# Patient Record
Sex: Male | Born: 1965 | Race: Black or African American | Hispanic: No | Marital: Single | Smoking: Current every day smoker
Health system: Southern US, Community
[De-identification: ages and names within clinical notes are randomized; demographics above are authoritative.]

## PROBLEM LIST (undated history)

## (undated) DIAGNOSIS — M5432 Sciatica, left side: Secondary | ICD-10-CM

## (undated) HISTORY — DX: Sciatica, left side: M54.32

---

## 2002-12-31 ENCOUNTER — Encounter: Payer: Self-pay | Admitting: Emergency Medicine

## 2002-12-31 ENCOUNTER — Emergency Department (HOSPITAL_COMMUNITY): Admission: EM | Admit: 2002-12-31 | Discharge: 2002-12-31 | Payer: Self-pay | Admitting: Emergency Medicine

## 2004-07-04 ENCOUNTER — Emergency Department (HOSPITAL_COMMUNITY): Admission: EM | Admit: 2004-07-04 | Discharge: 2004-07-04 | Payer: Self-pay | Admitting: Emergency Medicine

## 2006-11-11 ENCOUNTER — Emergency Department (HOSPITAL_COMMUNITY): Admission: EM | Admit: 2006-11-11 | Discharge: 2006-11-11 | Payer: Self-pay | Admitting: Emergency Medicine

## 2016-06-01 ENCOUNTER — Encounter (HOSPITAL_COMMUNITY): Payer: Self-pay | Admitting: Emergency Medicine

## 2016-06-01 ENCOUNTER — Emergency Department (HOSPITAL_COMMUNITY)
Admission: EM | Admit: 2016-06-01 | Discharge: 2016-06-01 | Disposition: A | Discharge status: Home / Self Care | Payer: Self-pay | Attending: Emergency Medicine | Admitting: Emergency Medicine

## 2016-06-01 DIAGNOSIS — F17200 Nicotine dependence, unspecified, uncomplicated: Secondary | ICD-10-CM | POA: Insufficient documentation

## 2016-06-01 DIAGNOSIS — M5442 Lumbago with sciatica, left side: Secondary | ICD-10-CM | POA: Insufficient documentation

## 2016-06-01 DIAGNOSIS — M5432 Sciatica, left side: Secondary | ICD-10-CM

## 2016-06-01 MED ORDER — CYCLOBENZAPRINE HCL 10 MG PO TABS: 10.0000 mg | ORAL_TABLET | Freq: Three times a day (TID) | ORAL | 0 refills | Status: DC | PRN

## 2016-06-01 MED ORDER — HYDROCODONE-ACETAMINOPHEN 5-325 MG PO TABS
1.0000 | ORAL_TABLET | Freq: Once | ORAL | Status: AC
  Administered 2016-06-01: 1 via ORAL
  Filled 2016-06-01: qty 1

## 2016-06-01 MED ORDER — HYDROCODONE-ACETAMINOPHEN 5-325 MG PO TABS: ORAL_TABLET | ORAL | 0 refills | Status: DC

## 2016-06-01 MED ORDER — NAPROXEN 500 MG PO TABS: 500.0000 mg | ORAL_TABLET | Freq: Two times a day (BID) | ORAL | 0 refills | Status: DC

## 2016-06-01 MED ORDER — METHOCARBAMOL 500 MG PO TABS
500.0000 mg | ORAL_TABLET | Freq: Once | ORAL | Status: AC
  Administered 2016-06-01: 500 mg via ORAL
  Filled 2016-06-01: qty 1

## 2016-06-01 MED ORDER — IBUPROFEN 800 MG PO TABS
800.0000 mg | ORAL_TABLET | Freq: Once | ORAL | Status: AC
  Administered 2016-06-01: 800 mg via ORAL
  Filled 2016-06-01: qty 1

## 2016-06-01 NOTE — ED Notes (Signed)
PA at the bedside.

## 2016-06-01 NOTE — Discharge Instructions (Signed)
Alternate ice and heat to your back.  Follow-up with your doctor or return here for any worsening symptoms °

## 2016-06-01 NOTE — ED Notes (Signed)
Patient given discharge instruction, verbalized understand. Patient ambulatory out of the department.  

## 2016-06-01 NOTE — ED Provider Notes (Signed)
[facility]PT Provider Note   CSN: [id number] Arrival date & time: 06/01/16  1915     History   Chief Complaint Chief Complaint  Patient presents with  . Leg Pain    HPI Derek Watts is a 50 y.o. male.  HPI   Derek Watts is a 50 y.o. male who presents to the Emergency Department complaining of low back pain, hip pain and pain that radiates into his left leg.  Pain has been worsening for several weeks.  He describes a pain that is sharp and stabbing in quality.  Pain is slightly relieved by certain positions but worsens with standing or walking.  He denies fever, chills, urine or bowel changes, numbness or weakness or recent injury.     History reviewed. No pertinent past medical history.  There are no active problems to display for this patient.   History reviewed. No pertinent surgical history.     Home Medications    Prior to Admission medications   Not on File    Family History No family history on file.  Social History Social History  Substance Use Topics  . Smoking status: Current Every Day Smoker  . Smokeless tobacco: Never Used  . Alcohol use Yes     Allergies   Review of patient's allergies indicates not on file.   Review of Systems Review of Systems  Constitutional: Negative for chills and fever.  Respiratory: Negative for shortness of breath.   Gastrointestinal: Negative for abdominal pain and vomiting.  Genitourinary: Negative for decreased urine volume, difficulty urinating, dysuria, flank pain and hematuria.  Musculoskeletal: Positive for back pain. Negative for arthralgias and joint swelling.  Skin: Negative for color change, rash and wound.  Neurological: Negative for weakness and numbness.  All other systems reviewed and are negative.    Physical Exam Updated Vital Signs BP 162/79   Pulse 94   Temp 98.6 F (37 C)   Resp 20   Ht 5\' 10"  (1.778 m)   Wt 74.4 kg   SpO2 99%   BMI 23.53 kg/m   Physical Exam    Constitutional: He is oriented to person, place, and time. He appears well-developed and well-nourished. No distress.  HENT:  Head: Normocephalic and atraumatic.  Neck: Normal range of motion. Neck supple.  Cardiovascular: Normal rate, regular rhythm, normal heart sounds and intact distal pulses.   No murmur heard. Pulmonary/Chest: Effort normal and breath sounds normal. No respiratory distress.  Abdominal: Soft. He exhibits no distension. There is no tenderness.  Musculoskeletal: He exhibits tenderness. He exhibits no edema.       Lumbar back: He exhibits tenderness and pain. He exhibits normal range of motion, no swelling, no deformity, no laceration and normal pulse.  ttp of the lower lumbar spine and left lumbar paraspinal muscles.  DP pulses are brisk and symmetrical.  Distal sensation intact.  Pt has 5/5 strength against resistance of bilateral lower extremities.     Neurological: He is alert and oriented to person, place, and time. He has normal strength. No sensory deficit. He exhibits normal muscle tone. Coordination and gait normal.  Reflex Scores:      Patellar reflexes are 2+ on the right side and 2+ on the left side.      Achilles reflexes are 2+ on the right side and 2+ on the left side. Skin: Skin is warm and dry. No rash noted.  Nursing note and vitals reviewed.    ED Treatments / Results  Labs (all labs  ordered are listed, but only abnormal results are displayed) Labs Reviewed - No data to display  EKG  EKG Interpretation None       Radiology No results found.  Procedures Procedures (including critical care time)  Medications Ordered in ED Medications  HYDROcodone-acetaminophen (NORCO/VICODIN) 5-325 MG per tablet 1 tablet (not administered)  ibuprofen (ADVIL,MOTRIN) tablet 800 mg (not administered)  methocarbamol (ROBAXIN) tablet 500 mg (not administered)     Initial Impression / Assessment and Plan / ED Course  I have reviewed the triage vital signs  and the nursing notes.  Pertinent labs & imaging results that were available during my care of the patient were reviewed by me and considered in my medical decision making (see chart for details).  Clinical Course    Left sciatica.  No focal neuro deficits on exam.  Ambulates with a steady gait.  No focal neuro deficits.  Agrees to symptomatic tx and close PMD f/u  Appears stable for d/c  Final Clinical Impressions(s) / ED Diagnoses   Final diagnoses:  Sciatica of left side    New Prescriptions New Prescriptions   No medications on file     Pauline Ausammy Yafet Cline, Cordelia Poche-C 06/06/16 1636    Bethann BerkshireJoseph Zammit, MD 06/06/16 2221

## 2016-06-01 NOTE — ED Triage Notes (Signed)
Pt c/o left leg that radiates to the hip and lower back for weeks.

## 2016-08-18 DIAGNOSIS — Z139 Encounter for screening, unspecified: Secondary | ICD-10-CM

## 2016-08-18 LAB — GLUCOSE, POCT (MANUAL RESULT ENTRY): POC GLUCOSE: 111 mg/dL — AB (ref 70–99)

## 2016-08-18 NOTE — Congregational Nurse Program (Signed)
Congregational Nurse Program Note  Date of Encounter: 08/18/2016  Past Medical History: No past medical history on file.  Encounter Details:     CNP Questionnaire - 08/18/16 1430      Patient Demographics   Is this a new or existing patient? New   Patient is considered a/an Not Applicable   Race African-American/Black     Patient Assistance   [facility] Patient Assistance [facility]   Patient's financial/insurance status Low Income;Self-Pay (Uninsured)   Uninsured Patient ([place] Card/[facility]) Yes   Interventions Assisted patient in making appt.   Patient referred to apply for the following financial assistance [facility];[place] [employer]/[facility] [employer]   Food insecurities addressed Referred to food bank or Chart[employer] loss adjusterresource   Transportation assistance No   Assistance securing medications No   Product/process development scientistducational health offerings Navigating the healthcare system     Encounter Details   Primary purpose of visit Education/Health Concerns;Navigating the Healthcare System   Was an Emergency Department visit averted? Not Applicable   Does patient have a medical provider? No   Patient referred to Area Agency;Clinic;Establish PCP   Was a mental health screening completed? (GAINS tool) No   Does patient have dental issues? Yes   Was a dental referral made? Yes  to connect to care connect also gave date of next dental bus in Jan, 2018   Does patient have vision issues? No   Does your patient have an abnormal blood pressure today? No   Since previous encounter, have you referred patient for abnormal blood pressure that resulted in a new diagnosis or medication change? No   Does your patient have an abnormal blood glucose today? No   Since previous encounter, have you referred patient for abnormal blood glucose that resulted in a new diagnosis or medication change? No   Was there a life-saving intervention made? No     [facility] Chief complaint today is ongoing  lower back and left hip pain that radiates down his left leg. Pain is sharp and client states it started 3 to 4 months ago and that he [facility] in October and was given flexeril, hydrocodone and naproxen for pain and told to come back if it didn't get better per client , however he never went back. Client is now seeking a primary care provider for general medical care as well as care regarding his back and left leg pain. Client also complains of dental pain. Alert and oriented to person, place and time. Client states he lives from [place] staying with family. He states that he has not worked in about a month and that was "temp" work. He states he does not recall injury to his back and states that he just "woke up like this". He relates that his pain becomes worse with walking or standing and that he has noticed a limp. Gait with a slight left limp. Client reports numbness of his left foot and pain starts in lower back, radiates down left hip area and down leg. Lying down relieves his pain. 4 + bilateral grips upper extremities. Client denies drug use and reports "one 12 ounce beer a week".  Client reports that other than the emergency room he has not seen a primary care doctor in many years.  PMH: none reported Past surgical history: none reported. Current complaints , lower back and left leg pain and left foot numbness, also states his left arm  gets numb sometimes at night when sleeping. He also reports getting up 3 to 4 times per night to urinate. Vitals today: Blood pressure 136/87, pulse 76, temp 98.2 orally, non-fasting blood sugar 111 Mental Health: Client denies any past suicide attempts and denies current thoughts of suicide. He states that he has no anxiety. Appears calm.  Plan: [facility] [place] and appointment secured for 08/25/16 at 1:15 pm. For general medical care.  Counseled client on starting an application with  the Housing Authority for possible housing options: Client aware that due to [employer] will re-open on 08/24/16 at 0800.  Referral for client to start [facility] discount application regarding past ER bill as well as possible need for further testing. Fransisca ConnorsAshley Hilton contact information [facility] to [facility] at [facility].  Referral also to the [employer] food pant[employer] for clothing vouchers and also food assistance. Client recently applied for food stamps as well as disability per client.  Dental: contact information for [facility] given to client as well as the date and location of the next [facility]. Address for rescue mission also given to client along with time to be there waiting and that patients will be seen on first come first serve basis. Client states understanding. He is complaining of dental pain that comes and goes and states he has been neglecting care of his teeth.   Client given list of all information as well as appointment card and RN contact information , along with educational handouts on healthy tips , decreasing stress and smoking cessation. Will follow up with client after his appointment at the free clinic.

## 2016-08-30 ENCOUNTER — Ambulatory Visit: Payer: Self-pay | Admitting: Physician Assistant

## 2016-08-30 ENCOUNTER — Encounter: Payer: Self-pay | Admitting: Physician Assistant

## 2016-08-30 VITALS — BP 146/70 | HR 105 | Temp 97.7°F | Ht 68.5 in | Wt 157.8 lb

## 2016-08-30 DIAGNOSIS — F1010 Alcohol abuse, uncomplicated: Secondary | ICD-10-CM

## 2016-08-30 DIAGNOSIS — F17210 Nicotine dependence, cigarettes, uncomplicated: Secondary | ICD-10-CM

## 2016-08-30 DIAGNOSIS — I10 Essential (primary) hypertension: Secondary | ICD-10-CM

## 2016-08-30 DIAGNOSIS — Z13220 Encounter for screening for lipoid disorders: Secondary | ICD-10-CM

## 2016-08-30 DIAGNOSIS — R269 Unspecified abnormalities of gait and mobility: Secondary | ICD-10-CM

## 2016-08-30 DIAGNOSIS — K219 Gastro-esophageal reflux disease without esophagitis: Secondary | ICD-10-CM

## 2016-08-30 DIAGNOSIS — G8929 Other chronic pain: Secondary | ICD-10-CM

## 2016-08-30 DIAGNOSIS — R079 Chest pain, unspecified: Secondary | ICD-10-CM

## 2016-08-30 DIAGNOSIS — Z125 Encounter for screening for malignant neoplasm of prostate: Secondary | ICD-10-CM

## 2016-08-30 DIAGNOSIS — Z131 Encounter for screening for diabetes mellitus: Secondary | ICD-10-CM

## 2016-08-30 DIAGNOSIS — M5432 Sciatica, left side: Secondary | ICD-10-CM

## 2016-08-30 DIAGNOSIS — M5442 Lumbago with sciatica, left side: Secondary | ICD-10-CM

## 2016-08-30 LAB — GLUCOSE, POCT (MANUAL RESULT ENTRY): POC GLUCOSE: 124 mg/dL — AB (ref 70–99)

## 2016-08-30 MED ORDER — METOPROLOL TARTRATE 50 MG PO TABS: 50.0000 mg | ORAL_TABLET | Freq: Two times a day (BID) | ORAL | 1 refills | Status: DC

## 2016-08-30 MED ORDER — RANITIDINE HCL 300 MG PO TABS: 300.0000 mg | ORAL_TABLET | Freq: Every day | ORAL | 1 refills | Status: DC

## 2016-08-30 NOTE — Progress Notes (Signed)
BP (!) 146/70 (BP Location: Left Arm, Patient Position: Sitting, Cuff Size: Normal)   Pulse (!) 105   Temp 97.7 F (36.5 C)   Ht 5' 8.5" (1.74 m)   Wt 157 lb 12 oz (71.6 kg)   SpO2 99%   BMI 23.64 kg/m    Subjective:    Patient ID: Derek Watts, male    DOB: Apr 11, 1966, 51 y.o.   MRN: [id number]  HPI: Derek Watts is a 51 y.o. male presenting on 08/30/2016 for New Patient (Initial Visit)   HPI   Pt has not gotten routine medical care in many years.  He States that he gets CP sometimes.  "Mostly comes in middle of night".  Pain lasts 15-20 minutes then he goes back to sleep.  No self treatment.  This pain does not come during the day.  Thinks  It's usually around 11pm or 12 when he gets the pain.  So he hasn't been asleep too long when he hurts.  No associated sob or nausea or diaphoresis.   Cp feels like a heavy weight on his chest.   He says it comes maybe once or twice/month.  He does not get the pain during the day even with exertion.  He denies SOB and DOE.    Pt c/o LBP started about 6 months ago.  Pain radiates in to LLE, sometimes down all the way into his feet.  States mainly on left side of body.   Pt doesn't remember any injury.    Pt is not working now.  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     Pt currently smokes about 1/2 ppd and he drinks.  He says he doesn't drink every day but will drink about 6 or more beers/day on 3 or 4 days/week.  He says that sometimes he will pass out from drinking so much alcohol.    He says that he never thought about whether he had a problem with alcohol, but says that now that I am asking, he says he may.   He does not use any street drugs.   Relevant past medical, surgical, family and social history reviewed and updated as indicated. Interim medical history since our last visit reviewed. Allergies and medications reviewed and updated.  No current outpatient prescriptions  on file.   Review of Systems  Constitutional: Negative for appetite change, chills, diaphoresis, fatigue, fever and unexpected weight change.  HENT: Positive for dental problem and sneezing. Negative for congestion, drooling, ear pain, facial swelling, hearing loss, mouth sores, sore throat, trouble swallowing and voice change.   Eyes: Negative for pain, discharge, redness, itching and visual disturbance.  Respiratory: Negative for cough, choking, shortness of breath and wheezing.   Cardiovascular: Positive for chest pain. Negative for palpitations and leg swelling.  Gastrointestinal: Negative for abdominal pain, blood in stool, constipation, diarrhea and vomiting.  Endocrine: Negative for cold intolerance, heat intolerance and polydipsia.  Genitourinary: Negative for decreased urine volume, dysuria and hematuria.  Musculoskeletal: Positive for arthralgias, back pain and gait problem.  Skin: Negative for rash.  Allergic/Immunologic: Negative for environmental allergies.  Neurological: Negative for seizures, syncope, light-headedness and headaches.  Hematological: Negative for adenopathy.  Psychiatric/Behavioral: Negative for agitation, dysphoric mood and suicidal ideas. The patient is not nervous/anxious.     Per HPI unless specifically indicated above     Objective:    BP (!) 146/70 (BP Location: Left Arm, Patient Position:  Sitting, Cuff Size: Normal)   Pulse (!) 105   Temp 97.7 F (36.5 C)   Ht 5' 8.5" (1.74 m)   Wt 157 lb 12 oz (71.6 kg)   SpO2 99%   BMI 23.64 kg/m   Wt Readings from Last 3 Encounters:  08/30/16 157 lb 12 oz (71.6 kg)  08/18/16 159 lb 9.6 oz (72.4 kg)  06/01/16 164 lb (74.4 kg)    Physical Exam  Constitutional: He is oriented to person, place, and time. He appears well-developed and well-nourished.  HENT:  Head: Normocephalic and atraumatic.  Mouth/Throat: Uvula is midline and oropharynx is clear and moist. Abnormal dentition. Dental caries present. No  dental abscesses. No oropharyngeal exudate.  Eyes: Conjunctivae and EOM are normal. Pupils are equal, round, and reactive to light.  Neck: Neck supple. No thyromegaly present.  Cardiovascular: Normal rate and regular rhythm.   Pulmonary/Chest: Effort normal and breath sounds normal. He has no wheezes. He has no rales.  Abdominal: Soft. Bowel sounds are normal. He exhibits no mass. There is no hepatosplenomegaly. There is no tenderness. There is no rigidity, no rebound, no guarding and no CVA tenderness.  Musculoskeletal: He exhibits no edema.  Lymphadenopathy:    He has no cervical adenopathy.  Neurological: He is alert and oriented to person, place, and time.  Skin: Skin is warm and dry. No rash noted.  Psychiatric: He has a normal mood and affect. His behavior is normal. Thought content normal.  Vitals reviewed.   EKG- NSR at 89 bmp. No ST elevation. No previous for comparison.    Results for orders placed or performed in visit on 08/30/16  POCT Glucose (CBG)  Result Value Ref Range   POC Glucose 124 (A) 70 - 99 mg/dl      Assessment & Plan:    Encounter Diagnoses  Name Primary?  . Essential hypertension Yes  . Chest pain, unspecified type   . Sciatica of left side   . Abnormality of gait   . Gastroesophageal reflux disease, esophagitis presence not specified   . Screening for diabetes mellitus (DM)   . Screening cholesterol level   . Screening for prostate cancer   . Alcohol abuse   . Cigarette nicotine dependence without complication   . Chronic low back pain with left-sided sciatica, unspecified back pain laterality     -pt will get Baseline labs drawn tomorrow morning -gave pt [facility] discount application to turn in asap -ordered Xray l-s spine/pelvis/hip  -rx metoprolol and asa for blood press -discussed with pt that his nocturnal pains are likely due to GERD.  counselsed on gerd and gave handout.  rx ranitidine -discussed alcohol with pt and his need to stop it.   Gave pt list of local AA meetings -discussed the role smoking plays in GERD and HTN but he says now isn't the time to try to stop if he is going to be trying to stop drinking -follow up 4 weeks.  RTO sooner prn worsening or new symptoms

## 2016-08-30 NOTE — Patient Instructions (Addendum)
rx metoprolol (for blood pressure) and ranitidine (for GERD) Start aspirin once daily Stop alcohol- go to AA meetings if needed Turn in [facility] discount application Fasting labs tomorrow morning Get xray tomorrow when turn in [facility] discount application Cut back or stop smoking  ____________________________________________________________  Heartburn Heartburn is a type of pain or discomfort that can happen in the throat or chest. It is often described as a burning pain. It may also cause a bad taste in the mouth. Heartburn may feel worse when you lie down or bend over, and it is often worse at night. Heartburn may be caused by stomach contents that move back up into the esophagus (reflux). Follow these instructions at home: Take these actions to decrease your discomfort and to help avoid complications. Diet  Follow a diet as recommended by your health care provider. This may involve avoiding foods and drinks such as:  Coffee and tea (with or without caffeine).  Drinks that contain alcohol.  Energy drinks and sports drinks.  Carbonated drinks or sodas.  Chocolate and cocoa.  Peppermint and mint flavorings.  Garlic and onions.  Horseradish.  Spicy and acidic foods, including peppers, chili powder, curry powder, vinegar, hot sauces, and barbecue sauce.  Citrus fruit juices and citrus fruits, such as oranges, lemons, and limes.  Tomato-based foods, such as red sauce, chili, salsa, and pizza with red sauce.  Fried and fatty foods, such as donuts, french fries, potato chips, and high-fat dressings.  High-fat meats, such as hot dogs and fatty cuts of red and white meats, such as rib eye steak, sausage, ham, and bacon.  High-fat dairy items, such as whole milk, butter, and cream cheese.  Eat small, frequent meals instead of large meals.  Avoid drinking large amounts of liquid with your meals.  Avoid eating meals during the 2-3 hours before bedtime.  Avoid lying down right after  you eat.  Do not exercise right after you eat. General instructions  Pay attention to any changes in your symptoms.  Take over-the-counter and prescription medicines only as told by your health care provider. Do not take aspirin, ibuprofen, or other NSAIDs unless your health care provider told you to do so.  Do not use any tobacco products, including cigarettes, chewing tobacco, and e-cigarettes. If you need help quitting, ask your health care provider.  Wear loose-fitting clothing. Do not wear anything tight around your waist that causes pressure on your abdomen.  Raise (elevate) the head of your bed about 6 inches (15 cm).  Try to reduce your stress, such as with yoga or meditation. If you need help reducing stress, ask your health care provider.  If you are overweight, reduce your weight to an amount that is healthy for you. Ask your health care provider for guidance about a safe weight loss goal.  Keep all follow-up visits as told by your health care provider. This is important. Contact a health care provider if:  You have new symptoms.  You have unexplained weight loss.  You have difficulty swallowing, or it hurts to swallow.  You have wheezing or a persistent cough.  Your symptoms do not improve with treatment.  You have frequent heartburn for more than two weeks. Get help right away if:  You have pain in your arms, neck, jaw, teeth, or back.  You feel sweaty, dizzy, or light-headed.  You have chest pain or shortness of breath.  You vomit and your vomit looks like blood or coffee grounds.  Your stool is bloody  or black. This information is not intended to replace advice given to you by your health care provider. Make sure you discuss any questions you have with your health care provider. Document Released: 01/01/2009 Document Revised: 01/21/2016 Document Reviewed: 12/10/2014 Elsevier Interactive Patient Education  2017 [employer].

## 2016-09-06 ENCOUNTER — Ambulatory Visit (HOSPITAL_COMMUNITY)
Admission: RE | Admit: 2016-09-06 | Discharge: 2016-09-06 | Disposition: A | Discharge status: Home / Self Care | Payer: Self-pay | Source: Ambulatory Visit | Attending: Physician Assistant | Admitting: Physician Assistant

## 2016-09-06 DIAGNOSIS — M549 Dorsalgia, unspecified: Secondary | ICD-10-CM | POA: Insufficient documentation

## 2016-09-06 DIAGNOSIS — M25559 Pain in unspecified hip: Secondary | ICD-10-CM | POA: Insufficient documentation

## 2016-09-08 LAB — COMPREHENSIVE METABOLIC PANEL
ALBUMIN: 4.5 g/dL (ref 3.6–5.1)
ALT: 8 U/L — ABNORMAL LOW (ref 9–46)
AST: 14 U/L (ref 10–35)
Alkaline Phosphatase: 88 U/L (ref 40–115)
BUN: 11 mg/dL (ref 7–25)
CHLORIDE: 107 mmol/L (ref 98–110)
CO2: 26 mmol/L (ref 20–31)
CREATININE: 0.9 mg/dL (ref 0.70–1.33)
Calcium: 9.1 mg/dL (ref 8.6–10.3)
Glucose, Bld: 73 mg/dL (ref 65–99)
POTASSIUM: 4.6 mmol/L (ref 3.5–5.3)
SODIUM: 141 mmol/L (ref 135–146)
Total Bilirubin: 0.5 mg/dL (ref 0.2–1.2)
Total Protein: 6.9 g/dL (ref 6.1–8.1)

## 2016-09-08 LAB — CBC WITH DIFFERENTIAL/PLATELET
Basophils Absolute: 0 cells/uL (ref 0–200)
Basophils Relative: 0 %
EOS ABS: 252 {cells}/uL (ref 15–500)
Eosinophils Relative: 3 %
HEMATOCRIT: 35.8 % — AB (ref 38.5–50.0)
HEMOGLOBIN: 11 g/dL — AB (ref 13.2–17.1)
LYMPHS ABS: 2268 {cells}/uL (ref 850–3900)
LYMPHS PCT: 27 %
MCH: 19.2 pg — ABNORMAL LOW (ref 27.0–33.0)
MCHC: 30.7 g/dL — ABNORMAL LOW (ref 32.0–36.0)
MCV: 62.6 fL — AB (ref 80.0–100.0)
MONO ABS: 588 {cells}/uL (ref 200–950)
MPV: 8.9 fL (ref 7.5–12.5)
Monocytes Relative: 7 %
Neutro Abs: 5292 cells/uL (ref 1500–7800)
Neutrophils Relative %: 63 %
Platelets: 261 10*3/uL (ref 140–400)
RBC: 5.72 MIL/uL (ref 4.20–5.80)
RDW: 19.4 % — ABNORMAL HIGH (ref 11.0–15.0)
WBC: 8.4 10*3/uL (ref 3.8–10.8)

## 2016-09-08 LAB — HEMOGLOBIN A1C
HEMOGLOBIN A1C: 4.8 % (ref <5.7)
Mean Plasma Glucose: 91 mg/dL

## 2016-09-08 LAB — TSH: TSH: 0.53 mIU/L (ref 0.40–4.50)

## 2016-09-08 LAB — LIPID PANEL
CHOL/HDL RATIO: 1.9 ratio (ref <5.0)
CHOLESTEROL: 137 mg/dL (ref <200)
HDL: 71 mg/dL (ref >40)
LDL Cholesterol: 56 mg/dL (ref <100)
TRIGLYCERIDES: 48 mg/dL (ref <150)
VLDL: 10 mg/dL (ref <30)

## 2016-09-08 LAB — VITAMIN B12: Vitamin B-12: 302 pg/mL (ref 200–1100)

## 2016-09-08 LAB — PSA: PSA: 1.3 ng/mL (ref <=4.0)

## 2016-09-13 LAB — VITAMIN B1: VITAMIN B1 (THIAMINE): 10 nmol/L (ref 8–30)

## 2016-09-27 ENCOUNTER — Ambulatory Visit: Payer: Self-pay | Admitting: Physician Assistant

## 2016-09-27 ENCOUNTER — Encounter: Payer: Self-pay | Admitting: Physician Assistant

## 2016-09-27 VITALS — BP 106/68 | HR 77 | Temp 97.3°F | Ht 68.5 in | Wt 158.0 lb

## 2016-09-27 DIAGNOSIS — I10 Essential (primary) hypertension: Secondary | ICD-10-CM

## 2016-09-27 DIAGNOSIS — M545 Low back pain: Secondary | ICD-10-CM

## 2016-09-27 DIAGNOSIS — G8929 Other chronic pain: Secondary | ICD-10-CM

## 2016-09-27 DIAGNOSIS — M549 Dorsalgia, unspecified: Secondary | ICD-10-CM

## 2016-09-27 DIAGNOSIS — K219 Gastro-esophageal reflux disease without esophagitis: Secondary | ICD-10-CM

## 2016-09-27 DIAGNOSIS — D649 Anemia, unspecified: Secondary | ICD-10-CM

## 2016-09-27 DIAGNOSIS — F17210 Nicotine dependence, cigarettes, uncomplicated: Secondary | ICD-10-CM

## 2016-09-27 NOTE — Patient Instructions (Signed)
Start iron to help anemia  Continue metoprolol and ranitidine  Return stool test (poop test)

## 2016-09-27 NOTE — Progress Notes (Signed)
BP 106/68 (BP Location: Left Arm, Patient Position: Sitting, Cuff Size: Normal)   Pulse 77   Temp 97.3 F (36.3 C)   Ht 5' 8.5" (1.74 m)   Wt 158 lb (71.7 kg)   SpO2 99%   BMI 23.67 kg/m    Subjective:    Patient ID: Derek Watts, male    DOB: Nov 07, 1965, 51 y.o.   MRN: [id number]  HPI: Derek Watts is a 51 y.o. male presenting on 09/27/2016 for Hypertension and Pain   HPI   Pt states he is no longer having any CP- none at all.  His last one was about one week after his new pt appt.   Pt turned in his CD app (given for xrays)  Pt has cut back drinking.  He says he doesn't drink liquor any more.   He continues to Smoke  Relevant past medical, surgical, family and social history reviewed and updated as indicated. Interim medical history since our last visit reviewed. Allergies and medications reviewed and updated.   Current Outpatient Prescriptions:  .  aspirin EC 81 MG tablet, Take 81 mg by mouth daily., Disp: , Rfl:  .  metoprolol (LOPRESSOR) 50 MG tablet, Take 1 tablet (50 mg total) by mouth 2 (two) times daily., Disp: 60 tablet, Rfl: 1 .  ranitidine (ZANTAC) 300 MG tablet, Take 1 tablet (300 mg total) by mouth at bedtime., Disp: 30 tablet, Rfl: 1  Review of Systems  Constitutional: Negative for appetite change, chills, diaphoresis, fatigue, fever and unexpected weight change.  HENT: Negative for congestion, drooling, ear pain, facial swelling, hearing loss, mouth sores, sneezing, sore throat, trouble swallowing and voice change.   Eyes: Negative for pain, discharge, redness, itching and visual disturbance.  Respiratory: Negative for cough, choking, shortness of breath and wheezing.   Cardiovascular: Negative for chest pain, palpitations and leg swelling.  Gastrointestinal: Negative for abdominal pain, blood in stool, constipation, diarrhea and vomiting.  Endocrine: Negative for cold intolerance, heat intolerance and polydipsia.  Genitourinary: Negative for  decreased urine volume, dysuria and hematuria.  Musculoskeletal: Negative for arthralgias, back pain and gait problem.  Skin: Negative for rash.  Allergic/Immunologic: Negative for environmental allergies.  Neurological: Negative for seizures, syncope, light-headedness and headaches.  Hematological: Negative for adenopathy.  Psychiatric/Behavioral: Negative for agitation, dysphoric mood and suicidal ideas. The patient is not nervous/anxious.     Per HPI unless specifically indicated above     Objective:    BP 106/68 (BP Location: Left Arm, Patient Position: Sitting, Cuff Size: Normal)   Pulse 77   Temp 97.3 F (36.3 C)   Ht 5' 8.5" (1.74 m)   Wt 158 lb (71.7 kg)   SpO2 99%   BMI 23.67 kg/m   Wt Readings from Last 3 Encounters:  09/27/16 158 lb (71.7 kg)  08/30/16 157 lb 12 oz (71.6 kg)  08/18/16 159 lb 9.6 oz (72.4 kg)    Physical Exam  Constitutional: He is oriented to person, place, and time. He appears well-developed and well-nourished.  HENT:  Head: Normocephalic and atraumatic.  Neck: Neck supple.  Cardiovascular: Normal rate and regular rhythm.   Pulmonary/Chest: Effort normal and breath sounds normal. He has no wheezes.  Abdominal: Soft. Bowel sounds are normal. There is no hepatosplenomegaly. There is no tenderness.  Musculoskeletal: He exhibits no edema.  Lymphadenopathy:    He has no cervical adenopathy.  Neurological: He is alert and oriented to person, place, and time.  Skin: Skin is warm and dry.  Psychiatric:  He has a normal mood and affect. His behavior is normal.  Vitals reviewed.   Results for orders placed or performed in visit on 08/30/16  HgB A1c  Result Value Ref Range   Hgb A1c MFr Bld 4.8 <5.7 %   Mean Plasma Glucose 91 mg/dL  PSA  Result Value Ref Range   PSA 1.3 <=4.0 ng/mL  Comprehensive Metabolic Panel (CMET)  Result Value Ref Range   Sodium 141 135 - 146 mmol/L   Potassium 4.6 3.5 - 5.3 mmol/L   Chloride 107 98 - 110 mmol/L   CO2  26 20 - 31 mmol/L   Glucose, Bld 73 65 - 99 mg/dL   BUN 11 7 - 25 mg/dL   Creat 0.90 0.70 - 1.33 mg/dL   Total Bilirubin 0.5 0.2 - 1.2 mg/dL   Alkaline Phosphatase 88 40 - 115 U/L   AST 14 10 - 35 U/L   ALT 8 (L) 9 - 46 U/L   Total Protein 6.9 6.1 - 8.1 g/dL   Albumin 4.5 3.6 - 5.1 g/dL   Calcium 9.1 8.6 - 10.3 mg/dL  CBC w/Diff/Platelet  Result Value Ref Range   WBC 8.4 3.8 - 10.8 K/uL   RBC 5.72 4.20 - 5.80 MIL/uL   Hemoglobin 11.0 (L) 13.2 - 17.1 g/dL   HCT 35.8 (L) 38.5 - 50.0 %   MCV 62.6 (L) 80.0 - 100.0 fL   MCH 19.2 (L) 27.0 - 33.0 pg   MCHC 30.7 (L) 32.0 - 36.0 g/dL   RDW 19.4 (H) 11.0 - 15.0 %   Platelets 261 140 - 400 K/uL   MPV 8.9 7.5 - 12.5 fL   Neutro Abs 5,292 1,500 - 7,800 cells/uL   Lymphs Abs 2,268 850 - 3,900 cells/uL   Monocytes Absolute 588 200 - 950 cells/uL   Eosinophils Absolute 252 15 - 500 cells/uL   Basophils Absolute 0 0 - 200 cells/uL   Neutrophils Relative % 63 %   Lymphocytes Relative 27 %   Monocytes Relative 7 %   Eosinophils Relative 3 %   Basophils Relative 0 %   Smear Review Criteria for review not met   B12  Result Value Ref Range   Vitamin B-12 302 200 - 1,100 pg/mL  Vitamin B1  Result Value Ref Range   Vitamin B1 (Thiamine) 10 8 - 30 nmol/L  Lipid Profile  Result Value Ref Range   Cholesterol 137 <200 mg/dL   Triglycerides 48 <150 mg/dL   HDL 71 >40 mg/dL   Total CHOL/HDL Ratio 1.9 <5.0 Ratio   VLDL 10 <30 mg/dL   LDL Cholesterol 56 <100 mg/dL  TSH  Result Value Ref Range   TSH 0.53 0.40 - 4.50 mIU/L  POCT Glucose (CBG)  Result Value Ref Range   POC Glucose 124 (A) 70 - 99 mg/dl      Assessment & Plan:    Encounter Diagnoses  Name Primary?  . Essential hypertension Yes  . Gastroesophageal reflux disease, esophagitis presence not specified   . Anemia, unspecified type   . Chronic low back pain, unspecified back pain laterality, with sciatica presence unspecified   . Cigarette nicotine dependence without  complication      -Reviewed labs with pt -Start iron for anemia. -BP much improved.  Pt to continue his metoprolol -continue rantitidine for GERD -Reviewed results xrays -Gave ifobt for colon cancer screening -F/u 3 months.  RTO sooner prn

## 2016-12-26 ENCOUNTER — Ambulatory Visit: Payer: Self-pay | Admitting: Physician Assistant

## 2016-12-26 ENCOUNTER — Encounter: Payer: Self-pay | Admitting: Physician Assistant

## 2016-12-26 VITALS — BP 118/66 | HR 103 | Temp 97.5°F | Ht 68.5 in | Wt 155.8 lb

## 2016-12-26 DIAGNOSIS — Z1211 Encounter for screening for malignant neoplasm of colon: Secondary | ICD-10-CM

## 2016-12-26 DIAGNOSIS — F1010 Alcohol abuse, uncomplicated: Secondary | ICD-10-CM

## 2016-12-26 DIAGNOSIS — D649 Anemia, unspecified: Secondary | ICD-10-CM

## 2016-12-26 DIAGNOSIS — F17210 Nicotine dependence, cigarettes, uncomplicated: Secondary | ICD-10-CM

## 2016-12-26 LAB — IFOBT (OCCULT BLOOD): IMMUNOLOGICAL FECAL OCCULT BLOOD TEST: NEGATIVE

## 2016-12-26 NOTE — Progress Notes (Signed)
BP 118/66 (BP Location: Left Arm, Patient Position: Sitting, Cuff Size: Normal)   Pulse (!) 103   Temp 97.5 F (36.4 C)   Ht 5' 8.5" (1.74 m)   Wt 155 lb 12 oz (70.6 kg)   SpO2 99%   BMI 23.34 kg/m    Subjective:    Patient ID: Derek Watts, male    DOB: September 06, 1965, 51 y.o.   MRN: [id number]  HPI: Derek Watts is a 51 y.o. male presenting on 12/26/2016 for Hypertension; Gastroesophageal Reflux; and Anemia   HPI   Pt states no problems with his GERD.  Denies palpitations, cp.  He did not start taking iron as recommended.   He is slowing down with his drinking.  He is drinking about 4 beers daily.  He is still smoking.     Pt brought back his iFOBT today.    Pt is feeling well today.  Relevant past medical, surgical, family and social history reviewed and updated as indicated. Interim medical history since our last visit reviewed. Allergies and medications reviewed and updated.   Current Outpatient Prescriptions:  .  aspirin EC 81 MG tablet, Take 81 mg by mouth daily., Disp: , Rfl:  .  metoprolol (LOPRESSOR) 50 MG tablet, Take 1 tablet (50 mg total) by mouth 2 (two) times daily. (Patient not taking: Reported on 12/26/2016), Disp: 60 tablet, Rfl: 1 .  ranitidine (ZANTAC) 300 MG tablet, Take 1 tablet (300 mg total) by mouth at bedtime. (Patient not taking: Reported on 12/26/2016), Disp: 30 tablet, Rfl: 1   Review of Systems  Constitutional: Negative for appetite change, chills, diaphoresis, fatigue, fever and unexpected weight change.  HENT: Negative for congestion, dental problem, drooling, ear pain, facial swelling, hearing loss, mouth sores, sneezing, sore throat, trouble swallowing and voice change.   Eyes: Negative for pain, discharge, redness, itching and visual disturbance.  Respiratory: Negative for cough, choking, shortness of breath and wheezing.   Cardiovascular: Negative for chest pain, palpitations and leg swelling.  Gastrointestinal: Negative for abdominal  pain, blood in stool, constipation, diarrhea and vomiting.  Endocrine: Negative for cold intolerance, heat intolerance and polydipsia.  Genitourinary: Negative for decreased urine volume, dysuria and hematuria.  Musculoskeletal: Positive for arthralgias and back pain. Negative for gait problem.  Skin: Negative for rash.  Allergic/Immunologic: Negative for environmental allergies.  Neurological: Negative for seizures, syncope, light-headedness and headaches.  Hematological: Negative for adenopathy.  Psychiatric/Behavioral: Negative for agitation, dysphoric mood and suicidal ideas. The patient is not nervous/anxious.     Per HPI unless specifically indicated above     Objective:    BP 118/66 (BP Location: Left Arm, Patient Position: Sitting, Cuff Size: Normal)   Pulse (!) 103   Temp 97.5 F (36.4 C)   Ht 5' 8.5" (1.74 m)   Wt 155 lb 12 oz (70.6 kg)   SpO2 99%   BMI 23.34 kg/m   Wt Readings from Last 3 Encounters:  12/26/16 155 lb 12 oz (70.6 kg)  09/27/16 158 lb (71.7 kg)  08/30/16 157 lb 12 oz (71.6 kg)    Physical Exam  Constitutional: He is oriented to person, place, and time. He appears well-developed and well-nourished.  HENT:  Head: Normocephalic and atraumatic.  Neck: Neck supple.  Cardiovascular: Normal rate and regular rhythm.   Pulmonary/Chest: Effort normal and breath sounds normal. He has no wheezes.  Abdominal: Soft. Bowel sounds are normal. There is no hepatosplenomegaly. There is no tenderness.  Musculoskeletal: He exhibits no edema.  Lymphadenopathy:  He has no cervical adenopathy.  Neurological: He is alert and oriented to person, place, and time.  Skin: Skin is warm and dry.  Psychiatric: He has a normal mood and affect. His behavior is normal.  Vitals reviewed.       Assessment & Plan:   Encounter Diagnoses  Name Primary?  Marland Kitchen Anemia, unspecified type Yes  . Cigarette nicotine dependence without complication   . Alcohol abuse   . Screening for  colon cancer      -will run iFOBT test -recheck h/h today. Pt counseled to start taking iron -pt counseled on abstainance from etoh and smoking cessation -pt toFollow up 4 months.  RTO sooner prn

## 2016-12-26 NOTE — Patient Instructions (Signed)
Start taking over the counter iron to help your anemia

## 2017-01-02 ENCOUNTER — Other Ambulatory Visit (HOSPITAL_COMMUNITY)
Admission: RE | Admit: 2017-01-02 | Discharge: 2017-01-02 | Disposition: A | Discharge status: Home / Self Care | Payer: Self-pay | Source: Ambulatory Visit | Attending: Physician Assistant | Admitting: Physician Assistant

## 2017-01-02 LAB — HEMOGLOBIN AND HEMATOCRIT, BLOOD
HCT: 36.3 % — ABNORMAL LOW (ref 39.0–52.0)
HEMOGLOBIN: 11.7 g/dL — AB (ref 13.0–17.0)

## 2017-02-23 ENCOUNTER — Other Ambulatory Visit: Payer: Self-pay | Admitting: Physician Assistant

## 2017-02-23 DIAGNOSIS — I10 Essential (primary) hypertension: Secondary | ICD-10-CM

## 2017-02-23 DIAGNOSIS — D649 Anemia, unspecified: Secondary | ICD-10-CM

## 2017-04-27 ENCOUNTER — Ambulatory Visit: Payer: Self-pay | Admitting: Physician Assistant

## 2017-05-03 ENCOUNTER — Encounter: Payer: Self-pay | Admitting: Physician Assistant

## 2021-05-25 ENCOUNTER — Encounter (HOSPITAL_COMMUNITY): Payer: Self-pay | Admitting: *Deleted

## 2021-05-25 ENCOUNTER — Emergency Department (HOSPITAL_COMMUNITY)
Admission: EM | Admit: 2021-05-25 | Discharge: 2021-05-25 | Disposition: A | Discharge status: Home / Self Care | Payer: BC Managed Care – PPO | Attending: Emergency Medicine | Admitting: Emergency Medicine

## 2021-05-25 ENCOUNTER — Emergency Department (HOSPITAL_COMMUNITY): Payer: BC Managed Care – PPO

## 2021-05-25 ENCOUNTER — Other Ambulatory Visit: Payer: Self-pay

## 2021-05-25 DIAGNOSIS — Z28310 Unvaccinated for covid-19: Secondary | ICD-10-CM | POA: Insufficient documentation

## 2021-05-25 DIAGNOSIS — Z79899 Other long term (current) drug therapy: Secondary | ICD-10-CM | POA: Insufficient documentation

## 2021-05-25 DIAGNOSIS — F17210 Nicotine dependence, cigarettes, uncomplicated: Secondary | ICD-10-CM | POA: Insufficient documentation

## 2021-05-25 DIAGNOSIS — I10 Essential (primary) hypertension: Secondary | ICD-10-CM | POA: Insufficient documentation

## 2021-05-25 DIAGNOSIS — Z7982 Long term (current) use of aspirin: Secondary | ICD-10-CM | POA: Diagnosis not present

## 2021-05-25 DIAGNOSIS — R059 Cough, unspecified: Secondary | ICD-10-CM | POA: Diagnosis not present

## 2021-05-25 DIAGNOSIS — G8929 Other chronic pain: Secondary | ICD-10-CM | POA: Insufficient documentation

## 2021-05-25 DIAGNOSIS — U071 COVID-19: Secondary | ICD-10-CM | POA: Insufficient documentation

## 2021-05-25 DIAGNOSIS — M79674 Pain in right toe(s): Secondary | ICD-10-CM | POA: Diagnosis not present

## 2021-05-25 DIAGNOSIS — M7989 Other specified soft tissue disorders: Secondary | ICD-10-CM | POA: Diagnosis not present

## 2021-05-25 LAB — RESP PANEL BY RT-PCR (FLU A&B, COVID) ARPGX2
Influenza A by PCR: NEGATIVE
Influenza B by PCR: NEGATIVE
SARS Coronavirus 2 by RT PCR: POSITIVE — AB

## 2021-05-25 MED ORDER — ACETAMINOPHEN 325 MG PO TABS
650.0000 mg | ORAL_TABLET | Freq: Once | ORAL | Status: AC
  Administered 2021-05-25: 650 mg via ORAL
  Filled 2021-05-25: qty 2

## 2021-05-25 MED ORDER — PAXLOVID (300/100) 20 X 150 MG & 10 X 100MG PO TBPK: ORAL_TABLET | ORAL | 0 refills | Status: DC
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
## 2021-05-25 NOTE — ED Notes (Signed)
Ambulated well and oxygen remained 95 and above while ambulating

## 2021-05-25 NOTE — Discharge Instructions (Addendum)
You will need to stay at home for the next 7 days in quarantine to avoid passing your infection onto others.  Rest make sure you are drinking plenty of fluids.  You may continue taking NyQuil for symptom relief.  Additionally I recommend ibuprofen if needed for fevers and chills.  You have also been prescribed Paxlovid which is an antiviral medication which may help this infection resolve quicker and without complications.  You do not have to take this medicine to get over this infection, some people choose to take it, others do not.  I have provided information about this medication and you may get this prescription filled at your drugstore if you decide you want to take this medicine.  You should start this no later than tomorrow for effectiveness.

## 2021-05-25 NOTE — ED Triage Notes (Signed)
FEVER AND CHILLS ONSET YESTERDAY

## 2021-05-31 DIAGNOSIS — R053 Chronic cough: Secondary | ICD-10-CM | POA: Diagnosis not present

## 2021-05-31 DIAGNOSIS — Z0001 Encounter for general adult medical examination with abnormal findings: Secondary | ICD-10-CM | POA: Diagnosis not present

## 2021-05-31 DIAGNOSIS — F17210 Nicotine dependence, cigarettes, uncomplicated: Secondary | ICD-10-CM | POA: Diagnosis not present
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
## 2021-11-08 IMAGING — DX DG CHEST 1V PORT
1 series · 1 of 1 positions shown · non-contrast
Comparison: None.

CLINICAL DATA: [id number] positive, cough, tobacco abuse

EXAM:
PORTABLE CHEST 1 VIEW

[chest ap]
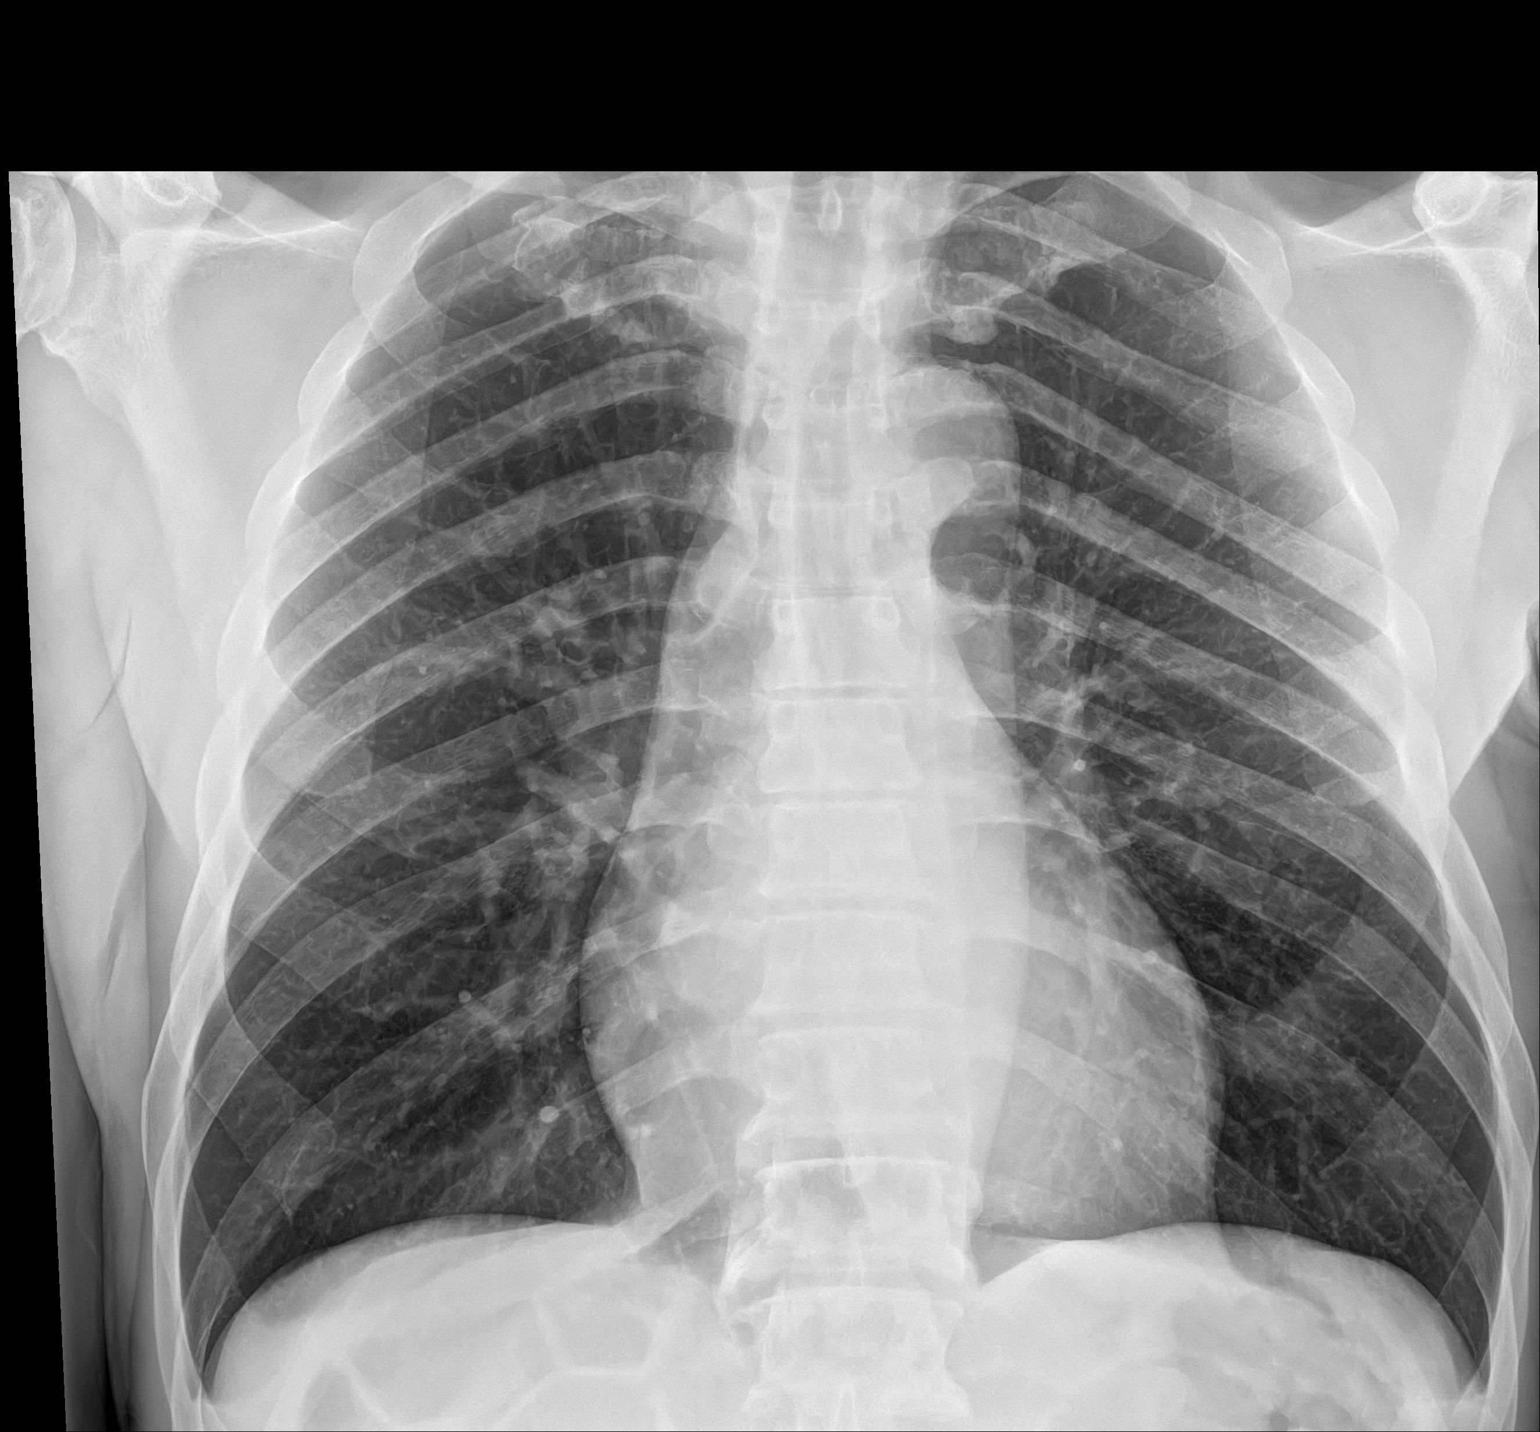

[1 of 1 positions shown; findings below may reference images not displayed]

FINDINGS: The heart size and mediastinal contours are within normal limits.
Both lungs are clear. The visualized skeletal structures are
unremarkable.
IMPRESSION: No active disease.

## 2022-01-22 ENCOUNTER — Emergency Department (HOSPITAL_COMMUNITY)
Admission: EM | Admit: 2022-01-22 | Discharge: 2022-01-22 | Discharge status: Against Medical Advice | Payer: BC Managed Care – PPO | Source: Home / Self Care

## 2022-07-11 ENCOUNTER — Encounter (HOSPITAL_COMMUNITY): Payer: Self-pay

## 2022-07-11 ENCOUNTER — Emergency Department (HOSPITAL_COMMUNITY)
Admission: EM | Admit: 2022-07-11 | Discharge: 2022-07-11 | Disposition: A | Discharge status: Home / Self Care | Payer: Self-pay | Attending: Emergency Medicine | Admitting: Emergency Medicine

## 2022-07-11 ENCOUNTER — Other Ambulatory Visit: Payer: Self-pay

## 2022-07-11 DIAGNOSIS — N342 Other urethritis: Secondary | ICD-10-CM

## 2022-07-11 DIAGNOSIS — Z7982 Long term (current) use of aspirin: Secondary | ICD-10-CM | POA: Insufficient documentation

## 2022-07-11 DIAGNOSIS — I10 Essential (primary) hypertension: Secondary | ICD-10-CM | POA: Insufficient documentation

## 2022-07-11 DIAGNOSIS — N341 Nonspecific urethritis: Secondary | ICD-10-CM | POA: Insufficient documentation

## 2022-07-11 LAB — URINALYSIS, ROUTINE W REFLEX MICROSCOPIC
Bilirubin Urine: NEGATIVE
Glucose, UA: NEGATIVE mg/dL
Hgb urine dipstick: NEGATIVE
Ketones, ur: NEGATIVE mg/dL
Nitrite: NEGATIVE
Protein, ur: 30 mg/dL — AB
Specific Gravity, Urine: 1.015 (ref 1.005–1.030)
WBC, UA: >50 WBC/hpf — ABNORMAL HIGH (ref 0–5)
pH: 5 (ref 5.0–8.0)

## 2022-07-11 MED ORDER — STERILE WATER FOR INJECTION IJ SOLN
INTRAMUSCULAR | Status: AC
  Administered 2022-07-11: 1 mL
  Filled 2022-07-11: qty 10

## 2022-07-11 MED ORDER — DOXYCYCLINE HYCLATE 100 MG PO CAPS: 100.0000 mg | ORAL_CAPSULE | Freq: Two times a day (BID) | ORAL | 0 refills | Status: DC

## 2022-07-11 MED ORDER — DOXYCYCLINE HYCLATE 100 MG PO TABS
100.0000 mg | ORAL_TABLET | Freq: Once | ORAL | Status: AC
  Administered 2022-07-11: 100 mg via ORAL
  Filled 2022-07-11: qty 1

## 2022-07-11 MED ORDER — CEFTRIAXONE SODIUM 500 MG IJ SOLR
500.0000 mg | Freq: Once | INTRAMUSCULAR | Status: AC
  Administered 2022-07-11: 500 mg via INTRAMUSCULAR
  Filled 2022-07-11: qty 500

## 2022-07-11 NOTE — Discharge Instructions (Addendum)
You have been treated today for the possibility of either gonorrhea or chlamydia as both of these infections can give the symptoms.  Cultures for these infections are currently being run and should be resulted within the next 2 days.  I recommend calling by Thursday morning to find out the results of these tests.  If either is positive, remember that your partner will need to be notified as they will need to be treated as well.  Do not have sex for the next 7 days while you are on these antibiotics or until your symptoms are completely gone.

## 2022-07-11 NOTE — ED Provider Notes (Signed)
[facility] EMERGENCY DEPARTMENT Provider Note   CSN: [id number] Arrival date & time: 07/11/22  1319     History  Chief Complaint  Patient presents with   Back Pain    Derek Watts is a 56 y.o. male with a history of hypertension, GERD and chronic back pain presenting for evaluation of painful urination which started this morning.  He endorses burning pain in his penis with urination, denies increased urinary frequency, urgency and has had no penile discharge.  He does endorse unprotected sex approximately 2 weeks ago and is concerned about possible STD.  He also has some pain across his lower back consistent with his chronic back pain.  He also mentions a swelling of his midline upper back which is nontender and started about a month ago.  Has had no treatment prior to arrival.  Denies abdominal pain, nausea, vomiting, no rash or wounds.  The history is provided by the patient.       Home Medications Prior to Admission medications   Medication Sig Start Date End Date Taking? Authorizing Provider  doxycycline (VIBRAMYCIN) 100 MG capsule Take 1 capsule (100 mg total) by mouth 2 (two) times daily. 07/11/22  Yes Jahdai Padovano, Raynelle Fanning, PA-C  aspirin EC 81 MG tablet Take 81 mg by mouth daily.    [provider]  nirmatrelvir & ritonavir (PAXLOVID, 300/100,) 20 x 150 MG & 10 x 100MG  TBPK Take 2 pink and 1 white tablet twice daily for 5 days. 05/25/21   05/27/21, PA-C      Allergies    Patient has no known allergies.    Review of Systems   Review of Systems  Constitutional:  Negative for chills and fever.  HENT: Negative.    Eyes: Negative.   Respiratory:  Negative for chest tightness and shortness of breath.   Cardiovascular:  Negative for chest pain.  Gastrointestinal:  Negative for abdominal pain, nausea and vomiting.  Genitourinary:  Positive for dysuria and penile pain. Negative for hematuria, penile discharge and urgency.  Musculoskeletal:  Positive for back pain.  Negative for arthralgias, joint swelling and neck pain.  Skin: Negative.  Negative for rash and wound.  Neurological:  Negative for dizziness, weakness, light-headedness, numbness and headaches.  Psychiatric/Behavioral: Negative.    All other systems reviewed and are negative.   Physical Exam Updated Vital Signs BP (!) 141/77   Pulse 78   Temp 98.5 F (36.9 C) (Oral)   Resp 16   Ht 5' 9.5" (1.765 m)   Wt 71.7 kg   SpO2 99%   BMI 23.00 kg/m  Physical Exam Vitals and nursing note reviewed.  Constitutional:      Appearance: He is well-developed.  HENT:     Head: Normocephalic and atraumatic.  Eyes:     Conjunctiva/sclera: Conjunctivae normal.  Cardiovascular:     Rate and Rhythm: Normal rate and regular rhythm.     Heart sounds: Normal heart sounds.  Pulmonary:     Effort: Pulmonary effort is normal.     Breath sounds: Normal breath sounds. No wheezing.  Abdominal:     General: Bowel sounds are normal.     Palpations: Abdomen is soft.     Tenderness: There is no abdominal tenderness. There is no guarding.  Genitourinary:    Comments: Deferred.   Musculoskeletal:        General: Normal range of motion.     Cervical back: Normal range of motion.  Skin:    General: Skin is  warm and dry.     Comments: Nontender freely mobile nodule midline thoracic spine consistent with a lipoma.  Additionally could be a sebaceous cyst but there is no obvious punctum or erythema.  Neurological:     Mental Status: He is alert.     ED Results / Procedures / Treatments   Labs (all labs ordered are listed, but only abnormal results are displayed) Labs Reviewed  URINALYSIS, ROUTINE W REFLEX MICROSCOPIC - Abnormal; Notable for the following components:      Result Value   APPearance CLOUDY (*)    Protein, ur 30 (*)    Leukocytes,Ua LARGE (*)    WBC, UA >50 (*)    Bacteria, UA RARE (*)    All other components within normal limits  GC/CHLAMYDIA PROBE AMP ([place]) NOT AT [facility]     EKG None  Radiology No results found.  Procedures Procedures    Medications Ordered in ED Medications  cefTRIAXone (ROCEPHIN) injection 500 mg (500 mg Intramuscular Given 07/11/22 1613)  doxycycline (VIBRA-TABS) tablet 100 mg (100 mg Oral Given 07/11/22 1612)  sterile water (preservative free) injection (1 mL  Given 07/11/22 1620)    ED Course/ Medical Decision Making/ A&P                           Medical Decision Making Patient presenting with dysuria with possible STD exposure, unprotected sex 2 weeks ago.  Chronic low back pain unchanged today.  Nodule midline upper back consistent with a possible sebaceous cyst versus lipoma.  No erythema or tenderness present.  Discussed treatment including antibiotics, abstinence x1 week, advised his cultures are pending and he will be notified if positive, his partner will need to be treated as well.  Patient is aware of the plan and is agreeable with this plan.   Amount and/or Complexity of Data Reviewed Labs: ordered.    Details: Urinalysis shows a large amount of leukocytes, rare bacteria.  Risk Prescription drug management.           Final Clinical Impression(s) / ED Diagnoses Final diagnoses:  Urethritis    Rx / DC Orders ED Discharge Orders          Ordered    doxycycline (VIBRAMYCIN) 100 MG capsule  2 times daily        07/11/22 1627              Burgess Amor, PA-C 07/11/22 1754    Glyn Ade, MD 07/12/22 [phone]

## 2022-07-11 NOTE — ED Triage Notes (Signed)
Pt presents to ED with complaints of lower back pain and pain when he urinates started this am.

## 2022-07-12 LAB — GC/CHLAMYDIA PROBE AMP (~~LOC~~) NOT AT ARMC
Chlamydia: NEGATIVE
Comment: NEGATIVE
Comment: NORMAL
Neisseria Gonorrhea: POSITIVE — AB

## 2023-01-19 DIAGNOSIS — I10 Essential (primary) hypertension: Secondary | ICD-10-CM | POA: Diagnosis not present

## 2023-01-19 DIAGNOSIS — Z8249 Family history of ischemic heart disease and other diseases of the circulatory system: Secondary | ICD-10-CM | POA: Diagnosis not present

## 2023-01-19 DIAGNOSIS — Z91199 Patient's noncompliance with other medical treatment and regimen due to unspecified reason: Secondary | ICD-10-CM | POA: Diagnosis not present

## 2023-01-19 DIAGNOSIS — Z720 Tobacco use: Secondary | ICD-10-CM | POA: Diagnosis not present

## 2023-07-09 ENCOUNTER — Encounter (HOSPITAL_COMMUNITY): Payer: Self-pay

## 2023-07-09 ENCOUNTER — Emergency Department (HOSPITAL_COMMUNITY)
Admission: EM | Admit: 2023-07-09 | Discharge: 2023-07-09 | Disposition: A | Discharge status: Home / Self Care | Payer: Medicaid Other | Attending: Emergency Medicine | Admitting: Emergency Medicine

## 2023-07-09 ENCOUNTER — Other Ambulatory Visit: Payer: Self-pay

## 2023-07-09 DIAGNOSIS — L02212 Cutaneous abscess of back [any part, except buttock]: Secondary | ICD-10-CM | POA: Diagnosis present

## 2023-07-09 DIAGNOSIS — Z7982 Long term (current) use of aspirin: Secondary | ICD-10-CM | POA: Diagnosis not present

## 2023-07-09 MED ORDER — LIDOCAINE-EPINEPHRINE (PF) 1 %-1:200000 IJ SOLN
30.0000 mL | Freq: Once | INTRAMUSCULAR | Status: AC
  Administered 2023-07-09: 30 mL
  Filled 2023-07-09: qty 30

## 2023-07-09 MED ORDER — DOXYCYCLINE HYCLATE 100 MG PO TABS
100.0000 mg | ORAL_TABLET | Freq: Once | ORAL | Status: AC
  Administered 2023-07-09: 100 mg via ORAL
  Filled 2023-07-09: qty 1

## 2023-07-09 MED ORDER — IBUPROFEN 600 MG PO TABS: 600.0000 mg | ORAL_TABLET | Freq: Four times a day (QID) | ORAL | 0 refills | Status: AC | PRN

## 2023-07-09 MED ORDER — IBUPROFEN 400 MG PO TABS
600.0000 mg | ORAL_TABLET | Freq: Once | ORAL | Status: AC
  Administered 2023-07-09: 600 mg via ORAL
  Filled 2023-07-09: qty 2

## 2023-07-09 MED ORDER — DOXYCYCLINE HYCLATE 100 MG PO CAPS: 100.0000 mg | ORAL_CAPSULE | Freq: Two times a day (BID) | ORAL | 0 refills | Status: DC

## 2023-07-09 NOTE — ED Notes (Signed)
ED Provider at bedside. 

## 2023-07-09 NOTE — ED Provider Notes (Signed)
[place] EMERGENCY DEPARTMENT AT [facility] Provider Note   CSN: [id number] Arrival date & time: 07/09/23  1120     History  Chief Complaint  Patient presents with   Abscess    Derek Watts is a 57 y.o. male.  He denies any significant PMH.  Presents the ER today complaining of an abscess to the middle of his back.  States he had a lump there for "a long time" states just over the past several days it has gotten larger and painful.  He is not sure if he has had any drainage from this.  He denies fevers or chills, no other complaints.   Abscess      Home Medications Prior to Admission medications   Medication Sig Start Date End Date Taking? Authorizing Provider  ibuprofen (ADVIL) 600 MG tablet Take 1 tablet (600 mg total) by mouth every 6 (six) hours as needed. 07/09/23  Yes Treylan Mcclintock A, PA-C  aspirin EC 81 MG tablet Take 81 mg by mouth daily.    [provider]  doxycycline (VIBRAMYCIN) 100 MG capsule Take 1 capsule (100 mg total) by mouth 2 (two) times daily. 07/09/23   Carmel Sacramento A, PA-C  nirmatrelvir & ritonavir (PAXLOVID, 300/100,) 20 x 150 MG & 10 x 100MG  TBPK Take 2 pink and 1 white tablet twice daily for 5 days. 05/25/21   Burgess Amor, PA-C      Allergies    Patient has no known allergies.    Review of Systems   Review of Systems  Physical Exam Updated Vital Signs BP (!) 139/108   Pulse 77   Temp 98 F (36.7 C) (Oral)   Resp 16   Ht 5\' 10"  (1.778 m)   Wt 72.6 kg   SpO2 99%   BMI 22.96 kg/m  Physical Exam Vitals and nursing note reviewed.  Constitutional:      General: He is not in acute distress.    Appearance: He is well-developed.  HENT:     Head: Normocephalic and atraumatic.  Eyes:     Conjunctiva/sclera: Conjunctivae normal.  Cardiovascular:     Rate and Rhythm: Normal rate and regular rhythm.     Heart sounds: No murmur heard. Pulmonary:     Effort: Pulmonary effort is normal. No respiratory distress.      Breath sounds: Normal breath sounds.  Abdominal:     Palpations: Abdomen is soft.     Tenderness: There is no abdominal tenderness.  Musculoskeletal:        General: No swelling.     Cervical back: Neck supple.  Skin:    General: Skin is warm and dry.     Capillary Refill: Capillary refill takes less than 2 seconds.     Comments: Approximately 2.5 cm fluctuant mass to thoracic area just to the right of midline.  Mild tenderness, mild overlying cellulitis.  No crepitus.  Neurological:     General: No focal deficit present.     Mental Status: He is alert and oriented to person, place, and time.  Psychiatric:        Mood and Affect: Mood normal.     ED Results / Procedures / Treatments   Labs (all labs ordered are listed, but only abnormal results are displayed) Labs Reviewed - No data to display  EKG None  Radiology No results found.  Procedures .Marland KitchenIncision and Drainage  Date/Time: 07/09/2023 2:38 PM  Performed by: Ma Rings, PA-C Authorized by: Carmel Sacramento  A, PA-C   Consent:    Consent obtained:  Verbal   Consent given by:  Patient   Risks discussed:  Bleeding, incomplete drainage, pain and infection   Alternatives discussed:  Observation Universal protocol:    Procedure explained and questions answered to patient or proxy's satisfaction: yes     Patient identity confirmed:  Verbally with patient Location:    Type:  Abscess   Size:  3 cm   Location:  Trunk   Trunk location:  Back Pre-procedure details:    Skin preparation:  Povidone-iodine Anesthesia:    Anesthesia method:  Local infiltration   Local anesthetic:  Lidocaine 1% WITH epi Procedure type:    Complexity:  Simple Procedure details:    Ultrasound guidance: no     Incision types:  Single straight   Incision depth:  Subcutaneous   Wound management:  Probed and deloculated   Drainage:  Purulent   Drainage amount:  Moderate   Wound treatment:  Wound left open   Packing materials:   None Post-procedure details:    Procedure completion:  Tolerated well, no immediate complications     Medications Ordered in ED Medications  lidocaine-EPINEPHrine (PF) (XYLOCAINE-EPINEPHrine) 1 %-1:200000 (PF) injection 30 mL (30 mLs Infiltration Given by Other 07/09/23 1322)  doxycycline (VIBRA-TABS) tablet 100 mg (100 mg Oral Given 07/09/23 1415)  ibuprofen (ADVIL) tablet 600 mg (600 mg Oral Given 07/09/23 1415)    ED Course/ Medical Decision Making/ A&P                                 Medical Decision Making Ddx: Cutaneous abscess, epidermal inclusion cyst, cellulitis, lipoma, other  ED course: Patient has soft tissue swelling increasing over the past couple days and has right upper back.  He always had a small bump there he reports it has gotten larger and more painful.  There is overlying cellulitis.  This was lysed and drained after discussion with patient who is agreeable and provided verbal consent.  Moderate purulent drainage and small amount of caseous material were drained.  Patient had improvement in symptoms.  Given some mild overlying status we will also treat with antibiotics.  Discussed need for recheck in 2 days urgent care as he does not have a PCP and ultimate follow-up with general surgery as the underlying cyst may need to be excised.  He is agreeable to plan of care and discharge.  He has no diabetes or other health problems, no systemic symptoms of illness.  Do not feel he needs labs or imaging at this time.  He is advised on wound care at home and was given strict return precautions.  Risk Prescription drug management.           Final Clinical Impression(s) / ED Diagnoses Final diagnoses:  Abscess of back    Rx / DC Orders ED Discharge Orders          Ordered    doxycycline (VIBRAMYCIN) 100 MG capsule  2 times daily        07/09/23 1437    ibuprofen (ADVIL) 600 MG tablet  Every 6 hours PRN        07/09/23 1438              Carmel Sacramento  A, PA-C 07/09/23 1443    Pricilla Loveless, MD 07/12/23 1511

## 2023-07-09 NOTE — ED Triage Notes (Signed)
Abscess on back for 7-8 months Hurt now to lay down due to size  Mid upper back

## 2023-07-09 NOTE — Discharge Instructions (Addendum)
You were seen in the ER today for abscess on your back.  We drained this abscess after starting you on antibiotics.  Keep the area clean and dry and change the bandage at least once a day more often if it is continuing to drain.  As we discussed, this is likely an underlying cyst that may need to be surgically removed to prevent recurrence.  Have a wound recheck at urgent care in 2 days.  If you have fever, severe pain or other worsening symptoms come back to the ER right away.

## 2023-10-29 ENCOUNTER — Other Ambulatory Visit: Payer: Self-pay

## 2023-10-29 ENCOUNTER — Encounter (HOSPITAL_COMMUNITY): Payer: Self-pay | Admitting: Emergency Medicine

## 2023-10-29 ENCOUNTER — Emergency Department (HOSPITAL_COMMUNITY)
Admission: EM | Admit: 2023-10-29 | Discharge: 2023-10-29 | Disposition: A | Discharge status: Home / Self Care | Attending: Emergency Medicine | Admitting: Emergency Medicine

## 2023-10-29 DIAGNOSIS — R300 Dysuria: Secondary | ICD-10-CM | POA: Diagnosis present

## 2023-10-29 LAB — URINALYSIS, ROUTINE W REFLEX MICROSCOPIC
Bacteria, UA: NONE SEEN
Bilirubin Urine: NEGATIVE
Glucose, UA: NEGATIVE mg/dL
Ketones, ur: NEGATIVE mg/dL
Nitrite: NEGATIVE
Protein, ur: NEGATIVE mg/dL
Specific Gravity, Urine: 1.013 (ref 1.005–1.030)
WBC, UA: >50 WBC/hpf (ref 0–5)
pH: 6 (ref 5.0–8.0)

## 2023-10-29 MED ORDER — STERILE WATER FOR INJECTION IJ SOLN
INTRAMUSCULAR | Status: AC
  Filled 2023-10-29: qty 10

## 2023-10-29 MED ORDER — CEFTRIAXONE SODIUM 500 MG IJ SOLR
500.0000 mg | Freq: Once | INTRAMUSCULAR | Status: AC
  Administered 2023-10-29: 500 mg via INTRAMUSCULAR
  Filled 2023-10-29: qty 500

## 2023-10-29 MED ORDER — DOXYCYCLINE HYCLATE 100 MG PO TABS
100.0000 mg | ORAL_TABLET | Freq: Once | ORAL | Status: AC
  Administered 2023-10-29: 100 mg via ORAL
  Filled 2023-10-29: qty 1

## 2023-10-29 MED ORDER — DOXYCYCLINE HYCLATE 100 MG PO CAPS: 100.0000 mg | ORAL_CAPSULE | Freq: Two times a day (BID) | ORAL | 0 refills | Status: AC

## 2023-10-29 NOTE — ED Triage Notes (Signed)
 Pt states that he had some lower back pain initially but that it transitioned to "burning in personal places". Pt states that it burns when he urinates as well.

## 2023-10-29 NOTE — ED Provider Notes (Signed)
 [place] EMERGENCY DEPARTMENT AT [facility] Provider Note   CSN: [id number] Arrival date & time: 10/29/23  0550     History Chief Complaint  Patient presents with   Burning in Personal Places    Derek Watts is a 58 y.o. male.  2-3 days of dysuria starting 2-3 days after unprotected intercourse. No back pain. No fevers. Doesn't know if partner has any symptoms as they broke up. Denies discharge or rash.    Home Medications Prior to Admission medications   Medication Sig Start Date End Date Taking? Authorizing Provider  doxycycline (VIBRAMYCIN) 100 MG capsule Take 1 capsule (100 mg total) by mouth 2 (two) times daily. One po bid x 7 days 10/29/23  Yes Deanza Upperman, Barbara Cower, MD  aspirin EC 81 MG tablet Take 81 mg by mouth daily.    [provider]  ibuprofen (ADVIL) 600 MG tablet Take 1 tablet (600 mg total) by mouth every 6 (six) hours as needed. 07/09/23   Carmel Sacramento A, PA-C      Allergies    Patient has no known allergies.    Review of Systems   Review of Systems  Physical Exam Updated Vital Signs BP (!) 152/87 (BP Location: Right Arm)   Pulse 88   Temp 99.2 F (37.3 C) (Oral)   Resp 18   Ht 5\' 10"  (1.778 m)   Wt 75.8 kg   SpO2 100%   BMI 23.96 kg/m  Physical Exam Vitals and nursing note reviewed.  Constitutional:      Appearance: He is well-developed.  HENT:     Head: Normocephalic and atraumatic.  Cardiovascular:     Rate and Rhythm: Normal rate.  Pulmonary:     Effort: Pulmonary effort is normal. No respiratory distress.  Abdominal:     General: There is no distension.  Genitourinary:    Comments: refuses Musculoskeletal:        General: Normal range of motion.     Cervical back: Normal range of motion.  Neurological:     Mental Status: He is alert.     ED Results / Procedures / Treatments   Labs (all labs ordered are listed, but only abnormal results are displayed) Labs Reviewed  URINALYSIS, ROUTINE W REFLEX MICROSCOPIC  - Abnormal; Notable for the following components:      Result Value   APPearance HAZY (*)    Hgb urine dipstick SMALL (*)    Leukocytes,Ua LARGE (*)    All other components within normal limits  URINE CULTURE  GC/CHLAMYDIA PROBE AMP ([place]) NOT AT [facility]    EKG None  Radiology No results found.  Procedures Procedures    Medications Ordered in ED Medications  cefTRIAXone (ROCEPHIN) injection 500 mg (500 mg Intramuscular Given 10/29/23 0701)  doxycycline (VIBRA-TABS) tablet 100 mg (100 mg Oral Given 10/29/23 0658)  sterile water (preservative free) injection (  Given 10/29/23 0701)    ED Course/ Medical Decision Making/ A&P                                 Medical Decision Making Amount and/or Complexity of Data Reviewed Labs: ordered.  Risk Prescription drug management.   UTI vs STD.   Urine with leukoctye esterase and >50 SBC but no obvious bacteria or nitrites. Could be c/w STD. Culture sent. GC/Chlam pending treated for GC/Chlam which should cover a male UTI as well. Will be abstinent until test  of cure.        Final Clinical Impression(s) / ED Diagnoses Final diagnoses:  Dysuria    Rx / DC Orders ED Discharge Orders          Ordered    doxycycline (VIBRAMYCIN) 100 MG capsule  2 times daily        10/29/23 0645              Jazline Cumbee, Barbara Cower, MD 10/29/23 2303

## 2023-10-30 LAB — GC/CHLAMYDIA PROBE AMP (~~LOC~~) NOT AT ARMC
Chlamydia: NEGATIVE
Comment: NEGATIVE
Comment: NORMAL
Neisseria Gonorrhea: POSITIVE — AB

## 2023-10-30 LAB — URINE CULTURE: Culture: NO GROWTH

## 2023-11-23 ENCOUNTER — Other Ambulatory Visit: Payer: Self-pay

## 2023-11-23 ENCOUNTER — Emergency Department (HOSPITAL_COMMUNITY)
Admission: EM | Admit: 2023-11-23 | Discharge: 2023-11-23 | Disposition: A | Discharge status: Home / Self Care | Attending: Emergency Medicine | Admitting: Emergency Medicine

## 2023-11-23 ENCOUNTER — Encounter (HOSPITAL_COMMUNITY): Payer: Self-pay

## 2023-11-23 ENCOUNTER — Emergency Department (HOSPITAL_COMMUNITY)

## 2023-11-23 DIAGNOSIS — M5442 Lumbago with sciatica, left side: Secondary | ICD-10-CM | POA: Diagnosis not present

## 2023-11-23 DIAGNOSIS — M545 Low back pain, unspecified: Secondary | ICD-10-CM | POA: Diagnosis present

## 2023-11-23 MED ORDER — DICLOFENAC SODIUM 75 MG PO TBEC: 75.0000 mg | DELAYED_RELEASE_TABLET | Freq: Two times a day (BID) | ORAL | 0 refills | Status: AC

## 2023-11-23 MED ORDER — DIAZEPAM 5 MG/ML IJ SOLN
5.0000 mg | Freq: Once | INTRAMUSCULAR | Status: AC
  Administered 2023-11-23: 5 mg via INTRAMUSCULAR
  Filled 2023-11-23: qty 2

## 2023-11-23 MED ORDER — METHOCARBAMOL 750 MG PO TABS: 750.0000 mg | ORAL_TABLET | Freq: Three times a day (TID) | ORAL | 0 refills | Status: AC | PRN

## 2023-11-23 MED ORDER — DEXAMETHASONE SODIUM PHOSPHATE 10 MG/ML IJ SOLN
10.0000 mg | Freq: Once | INTRAMUSCULAR | Status: AC
  Administered 2023-11-23: 10 mg via INTRAMUSCULAR
  Filled 2023-11-23: qty 1

## 2023-11-23 MED ORDER — KETOROLAC TROMETHAMINE 15 MG/ML IJ SOLN
15.0000 mg | Freq: Once | INTRAMUSCULAR | Status: AC
  Administered 2023-11-23: 15 mg via INTRAMUSCULAR
  Filled 2023-11-23: qty 1

## 2023-11-23 NOTE — Discharge Instructions (Addendum)
 Please take all medications as directed.  Follow-up closely with neurosurgery on an outpatient basis for any continued symptoms.  Return to emergency department immediately for any new or worsening symptoms.

## 2023-11-23 NOTE — ED Triage Notes (Signed)
 Pt arrived via [facility] c/o lower back pain. Pt seen here recently for same complaint. Pt reports pain intensified yesterday after he was cutting wood.

## 2023-11-23 NOTE — ED Notes (Signed)
 Patient transported to CT

## 2023-11-23 NOTE — ED Provider Notes (Signed)
 [place] EMERGENCY DEPARTMENT AT [facility] Provider Note   CSN: [id number] Arrival date & time: 11/23/23  1455     History  Chief Complaint  Patient presents with   Back Pain    Derek Watts is a 58 y.o. male.  Patient is a 58 year old male who presents emergency room with a chief complaint of lower back pain.  Patient notes he does have a history of chronic back pain and notes the pain became worse yesterday after cutting wood.  He notes that the pain is mainly located along the lateral aspects of his back.  He has had no changes in urination to include dysuria or hematuria.  He denies any urinary bowel incontinence, saddle paresthesias, gait changes, fever, chills, history of IV drug use, history HIV, history of cancer, history of chronic steroid use.  Patient notes that he does get some radiation of the pain and associated numbness to the left leg.  He denies any recent falls or blunt back trauma.   Back Pain      Home Medications Prior to Admission medications   Medication Sig Start Date End Date Taking? Authorizing Provider  aspirin EC 81 MG tablet Take 81 mg by mouth daily.    [provider]  doxycycline (VIBRAMYCIN) 100 MG capsule Take 1 capsule (100 mg total) by mouth 2 (two) times daily. One po bid x 7 days 10/29/23   Mesner, Barbara Cower, MD  ibuprofen (ADVIL) 600 MG tablet Take 1 tablet (600 mg total) by mouth every 6 (six) hours as needed. 07/09/23   Carmel Sacramento A, PA-C      Allergies    Patient has no known allergies.    Review of Systems   Review of Systems  Musculoskeletal:  Positive for back pain.  All other systems reviewed and are negative.   Physical Exam Updated Vital Signs BP 134/80 (BP Location: Right Arm)   Pulse 68   Temp 98.1 F (36.7 C) (Oral)   Resp 16   Ht 5\' 10"  (1.778 m)   Wt 75.8 kg   SpO2 97%   BMI 23.96 kg/m  Physical Exam Vitals and nursing note reviewed.  Constitutional:      Appearance: Normal  appearance.  HENT:     Head: Normocephalic and atraumatic.     Nose: Nose normal.     Mouth/Throat:     Mouth: Mucous membranes are moist.  Eyes:     Extraocular Movements: Extraocular movements intact.     Conjunctiva/sclera: Conjunctivae normal.     Pupils: Pupils are equal, round, and reactive to light.  Cardiovascular:     Rate and Rhythm: Normal rate and regular rhythm.     Pulses: Normal pulses.     Heart sounds: Normal heart sounds.  Pulmonary:     Effort: Pulmonary effort is normal. No respiratory distress.     Breath sounds: Normal breath sounds. No stridor. No wheezing, rhonchi or rales.  Abdominal:     General: Abdomen is flat. Bowel sounds are normal. There is no distension.     Palpations: Abdomen is soft. There is no mass.     Tenderness: There is no abdominal tenderness. There is no guarding.     Comments: No pulsating masses  Musculoskeletal:        General: Normal range of motion.     Cervical back: Normal range of motion and neck supple. No rigidity or tenderness.     Comments: Patient over paraspinous muscles of lower  back, nontender palpation over thoracic spine, mild tenderness over mid lumbar spine, no step-off or deformity, no CVA tenderness  Skin:    General: Skin is warm and dry.     Findings: No bruising or rash.  Neurological:     General: No focal deficit present.     Mental Status: He is alert and oriented to person, place, and time. Mental status is at baseline.     Cranial Nerves: No cranial nerve deficit.     Sensory: No sensory deficit.     Motor: No weakness.     Coordination: Coordination normal.     Gait: Gait normal.     Comments: Extension bilateral great toes intact, extension at hip flexors intact  Psychiatric:        Mood and Affect: Mood normal.        Behavior: Behavior normal.        Thought Content: Thought content normal.        Judgment: Judgment normal.     ED Results / Procedures / Treatments   Labs (all labs ordered are  listed, but only abnormal results are displayed) Labs Reviewed - No data to display  EKG None  Radiology No results found.  Procedures Procedures    Medications Ordered in ED Medications  dexamethasone (DECADRON) injection 10 mg (has no administration in time range)  ketorolac (TORADOL) 15 MG/ML injection 15 mg (has no administration in time range)  diazepam (VALIUM) injection 5 mg (has no administration in time range)    ED Course/ Medical Decision Making/ A&P                                 Medical Decision Making Amount and/or Complexity of Data Reviewed Radiology: ordered.  Risk Prescription drug management.   This patient presents to the ED for concern of back pain differential diagnosis includes muscle spasm, vertebral fracture, cauda equina syndrome, vertebral osteomyelitis, epidural abscess, muscle strain    Additional history obtained:  Additional history obtained from none External records from outside source obtained and reviewed including none   Imaging Studies ordered:  I ordered imaging studies including CT of lumbar spine I independently visualized and interpreted imaging which showed degenerative changes with straightening of the lumbar spine I agree with the radiologist interpretation   Medicines ordered and prescription drug management:  I ordered medication including Valium, Toradol, Decadron for back pain, muscle spasm Reevaluation of the patient after these medicines showed that the patient improved I have reviewed the patients home medicines and have made adjustments as needed   Problem List / ED Course:  Patient is doing better at this time and is stable for discharge home.  Patient has no concerning neurological deficits on exam and he is TUNAFISH negative.  CT of the lumbar spine demonstrates no acute osseous injury or lesions with associated generative changes and straightening of the lumbar spine.  Suspect muscle spasm at this time  and this is consistent with exam as well.  Patient has no tenderness throughout the abdomen and do not suspect an acute intra-abdominal pathology such as acute appendicitis, cholecystitis, bowel obstruction, diverticulitis, pyelonephritis, kidney stone, mesenteric ischemia, pancreatitis.  He has no associated pulsating masses on exam and do not suspect aortic aneurysm or dissection.  He has had symptoms similar to this in the past and do suspect that this is still consistent with muscle spasm.  Do not Spectamine etiology such  as cauda equina syndrome, vertebral osteomyelitis, epidural abscess.  The need for close follow-up on an outpatient basis was discussed as well as strict turn precautions for any new or worsening symptoms.  Patient voiced understanding to the plan and had no additional questions.   Social Determinants of Health:  None           Final Clinical Impression(s) / ED Diagnoses Final diagnoses:  None    Rx / DC Orders ED Discharge Orders     None         Kathlen Mody 11/23/23 1749    Rondel Baton, MD 11/28/23 1407

## 2023-11-23 NOTE — ED Notes (Signed)
 Patient discharged. Provider spoke to patient. Paperwork given to patient and reviewed. Pt verbalized understanding. VSS. A+Ox4. Patient wheeled to vehicle with family. No iv in place. Work note provided.
